# Patient Record
Sex: Female | Born: 1965 | Race: White | Hispanic: No | Marital: Married | Smoking: Former smoker
Health system: Southern US, Community
[De-identification: ages and names within clinical notes are randomized; demographics above are authoritative.]

## PROBLEM LIST (undated history)

## (undated) DIAGNOSIS — J45909 Unspecified asthma, uncomplicated: Secondary | ICD-10-CM

## (undated) DIAGNOSIS — O24419 Gestational diabetes mellitus in pregnancy, unspecified control: Secondary | ICD-10-CM

## (undated) DIAGNOSIS — J302 Other seasonal allergic rhinitis: Secondary | ICD-10-CM

## (undated) HISTORY — DX: Gestational diabetes mellitus in pregnancy, unspecified control: O24.419

## (undated) HISTORY — DX: Unspecified asthma, uncomplicated: J45.909

## (undated) HISTORY — DX: Other seasonal allergic rhinitis: J30.2

---

## 1998-11-05 HISTORY — PX: BREAST BIOPSY: SHX20

## 2013-07-29 ENCOUNTER — Encounter: Payer: Self-pay | Admitting: Sports Medicine

## 2013-07-29 ENCOUNTER — Ambulatory Visit (INDEPENDENT_AMBULATORY_CARE_PROVIDER_SITE_OTHER): Payer: PRIVATE HEALTH INSURANCE | Admitting: Sports Medicine

## 2013-07-29 VITALS — BP 122/78 | HR 59 | Ht 64.5 in | Wt 121.0 lb

## 2013-07-29 DIAGNOSIS — M25579 Pain in unspecified ankle and joints of unspecified foot: Secondary | ICD-10-CM

## 2013-07-29 DIAGNOSIS — M25572 Pain in left ankle and joints of left foot: Secondary | ICD-10-CM

## 2013-07-29 NOTE — Progress Notes (Signed)
CC: Left foot pain HPI: Patient is a pleasant 47 year old female recreational runner who presents with left foot pain since she stubbed her toe in April. She recalls that after the initial injury, where she jammed her toe against a bedpost, she had severe swelling in the area both on the dorsal and plantar aspect of the foot. Because of the swelling she was concerned that she had a fracture and went to see a podiatrist. She had x-rays that were negative. She was told she had a neuroma. She describes shooting pain in the area of the second MTP joint near the metatarsal head. At first it was extremely sensitive to the touch such that even being touched by the bedsheet caused pain. It has since improved and now only causes soreness with running. She initially did have some numbness and tingling however that has resolved. She was given a shoe insert to wear which has helped with her pain. She does note soreness after running. She has had to decrease her mileage and switch to a treadmill to minimize foot discomfort.  ROS: As above in the HPI. All other systems are stable or negative.  PMH: None   Social: Patient previously smoked but quit almost 30 years ago. She drinks 2 glasses of wine twice a week. She is married. Family: There is a strong family history of diabetes in father, grandfather, and grandmother. Mother also has high blood pressure.  Allergies: No known drug allergies    OBJECTIVE: APPEARANCE:  Patient in no acute distress.The patient appeared well nourished and normally developed. HEENT: No scleral icterus. Conjunctiva non-injected Resp: Non labored Skin: No rash MSK:  Foot exam:  - On inspection, there is noted to be no swelling or deformity.  - When standing, patient has neutral arch with no collapse of longitudinal or transverse arch. There is mild subluxation of the fourth and fifth toes. - With running patient is noted to have mild supination.  - There very mild tenderness to  palpation over the second MTP joint but no other tenderness to palpation.  - Ankle range of motion is full without pain. - Strength is 5 out of 5 in ankle and foot.  - Neurovascular status normal.  - No tenderness just proximal to the metatarsal heads - No hallux rigidus  Musculoskeletal [place]:  Limited ultrasound of the second MTP joint was performed in transverse and longitudinal views. On the plantar surface of the foot there is noted to be a hyperechoic region inside the joint space concerning for calcification or bony fragment. On the dorsal surface of the foot hyperechoic fragment was again appreciated along with some increased hypoechoic fluid around the joint space.  ASSESSMENT:  #1. Left second MTP joint pain secondary to probable intra-articular fracture in traumatic injury   PLAN:  Buddy tape the toes at all times when running. Continue sport insoles. We did try adding a neuroma pad to her left shoe. She was also given a metatarsal pad to try if she does not like the neuroma pad. Followup as needed. We did offer to make her a pair of orthotics in the future when her current pair wears out.

## 2013-07-29 NOTE — Patient Instructions (Signed)
Thank you for coming in today  Buddy tape toes when running Try pad on insert Return if you need new insoles or for persistent symptoms

## 2014-02-19 ENCOUNTER — Ambulatory Visit (INDEPENDENT_AMBULATORY_CARE_PROVIDER_SITE_OTHER): Payer: PRIVATE HEALTH INSURANCE | Admitting: Family Medicine

## 2014-02-19 ENCOUNTER — Encounter: Payer: Self-pay | Admitting: Family Medicine

## 2014-02-19 VITALS — BP 100/72 | Temp 98.3°F | Ht 64.0 in | Wt 130.0 lb

## 2014-02-19 DIAGNOSIS — Z7689 Persons encountering health services in other specified circumstances: Secondary | ICD-10-CM

## 2014-02-19 DIAGNOSIS — Z7189 Other specified counseling: Secondary | ICD-10-CM

## 2014-02-19 DIAGNOSIS — Z23 Encounter for immunization: Secondary | ICD-10-CM

## 2014-02-19 NOTE — Progress Notes (Signed)
Chief Complaint  Patient presents with  . Establish Care    HPI:  Colleen Fuentes is here to establish care.  Last PCP and physical: in May 2014 - [facility] - does blood work.  Has the following chronic problems and concerns today:  There are no active problems to display for this patient.  Health Maintenance:  ROS: See pertinent positives and negatives per HPI.  Past Medical History  Diagnosis Date  . Seasonal allergies   . Asthma     mild intermittent    Family History  Problem Relation Age of Onset  . Arthritis Mother   . Stroke Mother   . Heart attack Mother 65  . Rheum arthritis Sister   . Heart disease Sister 24  . Colon cancer Maternal Grandfather   . Heart disease      paternal side  . Diabetes Father   . Diabetes Paternal Grandmother     History   Social History  . Marital Status: Married    Spouse Name: N/A    Number of Children: N/A  . Years of Education: N/A   Social History Main Topics  . Smoking status: Former Smoker    Quit date: 11/06/1983  . Smokeless tobacco: Never Used     Comment: smoked for 6 years  . Alcohol Use: Yes     Comment: wine - 2 glases per day 3 days per week  . Drug Use: No  . Sexual Activity: None   Other Topics Concern  . 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, also Electronics engineer      Spiritual Beliefs: Catholic      Lifestyle: regular exercise (3 days per wee running and free weights and pilates) and healthy diet             Current outpatient prescriptions:cetirizine (ZYRTEC) 10 MG tablet, Take 10 mg by mouth daily., Disp: , Rfl: ;  cholecalciferol (VITAMIN D) 1000 UNITS tablet, Take 1,000 Units by mouth daily., Disp: , Rfl: ;  Cyanocobalamin (VITAMIN B 12) 100 MCG LOZG, Take by mouth., Disp: , Rfl: ;  Glucosamine-Chondroitin (GLUCOSAMINE CHONDR COMPLEX PO), Take by mouth., Disp: , Rfl: ;  Omega-3  Fatty Acids (FISH OIL) 1000 MG CPDR, Take by mouth., Disp: , Rfl:  vitamin E 100 UNIT capsule, Take by mouth daily., Disp: , Rfl:   EXAM:  Filed Vitals:   02/19/14 1432  BP: 100/72  Temp: 98.3 F (36.8 C)    Body mass index is 22.3 kg/(m^2).  GENERAL: vitals reviewed and listed above, alert, oriented, appears well hydrated and in no acute distress  HEENT: atraumatic, conjunttiva clear, no obvious abnormalities on inspection of external nose and ears  NECK: no obvious masses on inspection  LUNGS: clear to auscultation bilaterally, no wheezes, rales or rhonchi, good air movement  CV: HRRR, no peripheral edema  MS: moves all extremities without noticeable abnormality  PSYCH: pleasant and cooperative, no obvious depression or anxiety  ASSESSMENT AND PLAN:  Discussed the following assessment and plan:  No diagnosis found. -We reviewed the PMH, PSH, FH, SH, Meds and Allergies. -We provided refills for any medications we will prescribe as needed. -We addressed current concerns per orders and patient instructions. -We have asked for records for pertinent exams, studies, vaccines and notes from previous providers. -We have advised patient to follow up per  instructions below.   -Patient advised to return or notify a doctor immediately if symptoms worsen or persist or new concerns arise.  Patient Instructions  -PLEASE SIGN UP FOR MYCHART TODAY   We recommend the following healthy lifestyle measures: - eat a healthy diet consisting of lots of vegetables, fruits, beans, nuts, seeds, healthy meats such as white chicken and fish and whole grains.  - avoid fried foods, fast food, processed foods, sodas, red meet and other fattening foods.  - get a least 150 minutes of aerobic exercise per week.   Follow up in: 1 year and as needed      Lucretia Kern

## 2014-02-19 NOTE — Patient Instructions (Signed)
-  PLEASE SIGN UP FOR MYCHART TODAY   We recommend the following healthy lifestyle measures: - eat a healthy diet consisting of lots of vegetables, fruits, beans, nuts, seeds, healthy meats such as white chicken and fish and whole grains.  - avoid fried foods, fast food, processed foods, sodas, red meet and other fattening foods.  - get a least 150 minutes of aerobic exercise per week.   Follow up in: 1 year and as needed

## 2014-02-19 NOTE — Progress Notes (Signed)
Pre visit review using our clinic review tool, if applicable. No additional management support is needed unless otherwise documented below in the visit note. 

## 2014-04-26 ENCOUNTER — Ambulatory Visit (INDEPENDENT_AMBULATORY_CARE_PROVIDER_SITE_OTHER): Payer: PRIVATE HEALTH INSURANCE | Admitting: Physician Assistant

## 2014-04-26 ENCOUNTER — Encounter: Payer: Self-pay | Admitting: Physician Assistant

## 2014-04-26 VITALS — BP 100/60 | HR 60 | Temp 98.4°F | Resp 16 | Ht 64.0 in | Wt 130.0 lb

## 2014-04-26 DIAGNOSIS — E041 Nontoxic single thyroid nodule: Secondary | ICD-10-CM

## 2014-04-26 DIAGNOSIS — Z0000 Encounter for general adult medical examination without abnormal findings: Secondary | ICD-10-CM

## 2014-04-26 LAB — CBC WITH DIFFERENTIAL/PLATELET
BASOS ABS: 0 10*3/uL (ref 0.0–0.1)
Basophils Relative: 0 % (ref 0–1)
EOS ABS: 0 10*3/uL (ref 0.0–0.7)
EOS PCT: 0 % (ref 0–5)
HCT: 39.4 % (ref 36.0–46.0)
Hemoglobin: 13.1 g/dL (ref 12.0–15.0)
Lymphocytes Relative: 26 % (ref 12–46)
Lymphs Abs: 1.8 10*3/uL (ref 0.7–4.0)
MCH: 28.3 pg (ref 26.0–34.0)
MCHC: 33.2 g/dL (ref 30.0–36.0)
MCV: 85.1 fL (ref 78.0–100.0)
Monocytes Absolute: 0.3 10*3/uL (ref 0.1–1.0)
Monocytes Relative: 4 % (ref 3–12)
Neutro Abs: 4.9 10*3/uL (ref 1.7–7.7)
Neutrophils Relative %: 70 % (ref 43–77)
PLATELETS: 181 10*3/uL (ref 150–400)
RBC: 4.63 MIL/uL (ref 3.87–5.11)
RDW: 15.9 % — AB (ref 11.5–15.5)
WBC: 7 10*3/uL (ref 4.0–10.5)

## 2014-04-26 NOTE — Progress Notes (Signed)
Complete Physical  Assessment and Plan: Allergies Asthma Thyroid nodules- get U/S especially with family history of thyroid cancer Health Maintenance  Discussed med's effects and SE's. Screening labs and tests as requested with regular follow-up as recommended.  HPI 48 y.o. female  presents for a new patient complete physical. Her blood pressure has been controlled at home, today their BP is BP: 100/60 mmHg She does workout. She denies chest pain, shortness of breath, dizziness.  She has had very irregular periods, she is seeing Dr. Lynnette Caffey at [facility] and has a history of very bad fibroids and is getting monitored for this.  She states that she has some problems with waking up at 3 AM, worse when she drinks wine or right before her menstrual period.  She has very fiber cystic breast, will get 3D MGM in 1 week.   Current Medications:  Current Outpatient Prescriptions on File Prior to Visit  Medication Sig Dispense Refill  . cetirizine (ZYRTEC) 10 MG tablet Take 10 mg by mouth daily.      . cholecalciferol (VITAMIN D) 1000 UNITS tablet Take 1,000 Units by mouth daily.      . Cyanocobalamin (VITAMIN B 12) 100 MCG LOZG Take 1,000 mcg by mouth daily.       . Glucosamine-Chondroitin (GLUCOSAMINE CHONDR COMPLEX PO) Take 1,500 mg by mouth.       . Omega-3 Fatty Acids (FISH OIL) 1000 MG CPDR Take 1,280 mg by mouth.       . vitamin E 100 UNIT capsule Take 400 Units by mouth daily.        No current facility-administered medications on file prior to visit.   Health Maintenance:   Immunization History  Administered Date(s) Administered  . Tdap 02/19/2014   Tetanus: 2015 Pneumovax: N/A Flu vaccine: N/A Zostavax:N/A Pap: Dec 2014 MGM: in 1 week DEXA: N/A Colonoscopy: N/A EGD: N/A  Allergies:  Allergies  Allergen Reactions  . Niacin And Related    Medical History:  Past Medical History  Diagnosis Date  . Seasonal allergies   . Asthma     mild intermittent  .  Gestational diabetes    Surgical History:  Past Surgical History  Procedure Laterality Date  . Cesarean section    . Breast biopsy  2000    lipoma   Family History:  Family History  Problem Relation Age of Onset  . Arthritis Mother   . Stroke Mother   . Heart attack Mother 37  . Rheum arthritis Sister   . Heart disease Sister 77  . Colon cancer Maternal Grandfather   . Heart disease      paternal side  . Diabetes Father   . Diabetes Paternal Grandmother   . Cancer Sister     thyroid  . Diabetes Sister   . Heart attack Sister    Social History:  History  Substance Use Topics  . Smoking status: Former Smoker    Quit date: 11/06/1983  . Smokeless tobacco: Never Used     Comment: smoked for 6 years  . Alcohol Use: Yes     Comment: wine - 2 glasses per day 3 days per week     Review of Systems: [X]  = complains of  [ ]  = denies  General: Fatigue [ ]  Fever [ ]  Chills [ ]  Weakness [ ]   Insomnia [ ] Weight change [ ]  Night sweats [ ]   Change in appetite [ ]  Eyes: Redness [ ]  Blurred vision [ ]  Diplopia [ ]   Discharge [ ]   ENT: Congestion [ ]  Sinus Pain [ ]  Post Nasal Drip [ ]  Sore Throat [ ]  Earache [ ]  hearing loss [ ]  Tinnitus [ ]  Snoring [ ]   Cardiac: Chest pain/pressure [ ]  SOB [ ]  Orthopnea [ ]   Palpitations [ ]   Paroxysmal nocturnal dyspnea[ ]  Claudication [ ]  Edema [ ]   Pulmonary: Cough [ ]  Wheezing[ ]   SOB [ ]   Pleurisy [ ]   GI: Nausea [ ]  Vomiting[ ]  Dysphagia[ ]  Heartburn[ ]  Abdominal pain [ ]  Constipation [ ] ; Diarrhea [ ]  BRBPR [ ]  Melena[ ]  Bloating [ ]  Hemorrhoids [ ]   GU: Hematuria[ ]  Dysuria [ ]  Nocturia[ ]  Urgency [ ]   Hesitancy [ ]  Discharge [ ]  Frequency [ ]   Breast:  Breast lumps [ ]   nipple discharge [ ]    Neuro: Headaches[ ]  Vertigo[ ]  Paresthesias[ ]  Spasm [ ]  Speech changes [ ]  Incoordination [ ]   Ortho: Arthritis [ ]  Joint pain [ ]  Muscle pain [ ]  Joint swelling [ ]  Back Pain [ ]  Skin:  Rash [ ]   Pruritis [ ]  Change in skin lesion [ ]   Psych:  Depression[ ]  Anxiety[ ]  Confusion [ ]  Memory loss [ ]   Heme/Lypmh: Bleeding [ ]  Bruising [ ]  Enlarged lymph nodes [ ]   Endocrine: Visual blurring [ ]  Paresthesia [ ]  Polyuria [ ]  Polydypsea [ ]    Heat/cold intolerance [ ]  Hypoglycemia [ ]   Physical Exam: Estimated body mass index is 22.3 kg/(m^2) as calculated from the following:   Height as of this encounter: 5\' 4"  (1.626 m).   Weight as of this encounter: 130 lb (58.968 kg). BP 100/60  Pulse 60  Temp(Src) 98.4 F (36.9 C)  Resp 16  Ht 5\' 4"  (1.626 m)  Wt 130 lb (58.968 kg)  BMI 22.30 kg/m2  LMP 04/24/2014 General Appearance: Well nourished, in no apparent distress. Eyes: PERRLA, EOMs, conjunctiva no swelling or erythema, normal fundi and vessels. Sinuses: No Frontal/maxillary tenderness ENT/Mouth: Ext aud canals clear, normal light reflex with TMs without erythema, bulging.  Good dentition. No erythema, swelling, or exudate on post pharynx. Tonsils not swollen or erythematous. Hearing normal.  Neck: Supple, thyroid slightly enlarged with nodule on right side. No bruits Respiratory: Respiratory effort normal, BS equal bilaterally without rales, rhonchi, wheezing or stridor. Cardio: RRR without murmurs, rubs or gallops. Brisk peripheral pulses without edema.  Chest: symmetric, with normal excursions and percussion. Breasts: defer Abdomen: Soft, +BS. Non tender, no guarding, rebound, hernias, masses, or organomegaly. .  Lymphatics: Non tender without lymphadenopathy.  Genitourinary: defer Musculoskeletal: Full ROM all peripheral extremities,5/5 strength, and normal gait. Skin: Warm, dry without rashes, lesions, ecchymosis. Right lower back with 3x4 mm nevus, will monitor Neuro: Cranial nerves intact, reflexes equal bilaterally. Normal muscle tone, no cerebellar symptoms. Sensation intact.  Psych: Awake and oriented X 3, normal affect, Insight and Judgment appropriate.   EKG: WNL no changes.   Vicie Mutters 3:43 PM

## 2014-04-26 NOTE — Patient Instructions (Signed)

## 2014-04-27 ENCOUNTER — Encounter (INDEPENDENT_AMBULATORY_CARE_PROVIDER_SITE_OTHER): Payer: Self-pay

## 2014-04-27 LAB — HEPATIC FUNCTION PANEL
ALBUMIN: 4.6 g/dL (ref 3.5–5.2)
ALT: 13 U/L (ref 0–35)
AST: 24 U/L (ref 0–37)
Alkaline Phosphatase: 49 U/L (ref 39–117)
BILIRUBIN TOTAL: 0.7 mg/dL (ref 0.2–1.2)
Bilirubin, Direct: 0.2 mg/dL (ref 0.0–0.3)
Indirect Bilirubin: 0.5 mg/dL (ref 0.2–1.2)
Total Protein: 7.4 g/dL (ref 6.0–8.3)

## 2014-04-27 LAB — LIPID PANEL
CHOLESTEROL: 145 mg/dL (ref 0–200)
HDL: 71 mg/dL (ref >39)
LDL Cholesterol: 63 mg/dL (ref 0–99)
Total CHOL/HDL Ratio: 2 Ratio
Triglycerides: 56 mg/dL (ref <150)
VLDL: 11 mg/dL (ref 0–40)

## 2014-04-27 LAB — URINALYSIS, ROUTINE W REFLEX MICROSCOPIC
Bilirubin Urine: NEGATIVE
Glucose, UA: NEGATIVE mg/dL
Ketones, ur: NEGATIVE mg/dL
LEUKOCYTES UA: NEGATIVE
NITRITE: NEGATIVE
PROTEIN: NEGATIVE mg/dL
Specific Gravity, Urine: 1.025 (ref 1.005–1.030)
Urobilinogen, UA: 0.2 mg/dL (ref 0.0–1.0)
pH: 5.5 (ref 5.0–8.0)

## 2014-04-27 LAB — MAGNESIUM: Magnesium: 1.9 mg/dL (ref 1.5–2.5)

## 2014-04-27 LAB — BASIC METABOLIC PANEL WITH GFR
BUN: 12 mg/dL (ref 6–23)
CALCIUM: 9.7 mg/dL (ref 8.4–10.5)
CO2: 27 mEq/L (ref 19–32)
CREATININE: 0.66 mg/dL (ref 0.50–1.10)
Chloride: 104 mEq/L (ref 96–112)
Glucose, Bld: 83 mg/dL (ref 70–99)
Potassium: 4.2 mEq/L (ref 3.5–5.3)
SODIUM: 137 meq/L (ref 135–145)

## 2014-04-27 LAB — URINALYSIS, MICROSCOPIC ONLY
Bacteria, UA: NONE SEEN
Casts: NONE SEEN
Crystals: NONE SEEN
SQUAMOUS EPITHELIAL / LPF: NONE SEEN

## 2014-04-27 LAB — MICROALBUMIN / CREATININE URINE RATIO
Creatinine, Urine: 167.8 mg/dL
Microalb Creat Ratio: 4.4 mg/g (ref 0.0–30.0)
Microalb, Ur: 0.74 mg/dL (ref 0.00–1.89)

## 2014-04-27 LAB — TSH: TSH: 0.886 u[IU]/mL (ref 0.350–4.500)

## 2014-04-27 LAB — IRON AND TIBC
%SAT: 13 % — ABNORMAL LOW (ref 20–55)
Iron: 60 ug/dL (ref 42–145)
TIBC: 446 ug/dL (ref 250–470)
UIBC: 386 ug/dL (ref 125–400)

## 2014-04-27 LAB — INSULIN, FASTING: Insulin fasting, serum: 3 u[IU]/mL (ref 3–28)

## 2014-04-27 LAB — FERRITIN: FERRITIN: 7 ng/mL — AB (ref 10–291)

## 2014-04-27 LAB — VITAMIN B12: Vitamin B-12: 1086 pg/mL — ABNORMAL HIGH (ref 211–911)

## 2014-04-27 LAB — HEMOGLOBIN A1C
Hgb A1c MFr Bld: 5.3 % (ref <5.7)
MEAN PLASMA GLUCOSE: 105 mg/dL (ref <117)

## 2014-04-27 LAB — VITAMIN D 25 HYDROXY (VIT D DEFICIENCY, FRACTURES): VIT D 25 HYDROXY: 56 ng/mL (ref 30–89)

## 2014-04-28 ENCOUNTER — Ambulatory Visit
Admission: RE | Admit: 2014-04-28 | Discharge: 2014-04-28 | Disposition: A | Discharge status: Home / Self Care | Payer: PRIVATE HEALTH INSURANCE | Source: Ambulatory Visit | Attending: Physician Assistant | Admitting: Physician Assistant

## 2014-04-28 DIAGNOSIS — E041 Nontoxic single thyroid nodule: Secondary | ICD-10-CM

## 2014-04-29 ENCOUNTER — Encounter: Payer: Self-pay | Admitting: Physician Assistant

## 2015-04-23 IMAGING — US US SOFT TISSUE HEAD/NECK
1 series · 14 of 25 positions shown · non-contrast
Comparison: None.

CLINICAL DATA: Elevated thyroid levels

EXAM:
THYROID ULTRASOUND
TECHNIQUE: Ultrasound examination of the thyroid gland and adjacent soft
tissues was performed.

[Series 1: us soft tissue head/neck · 0.10mm/px · 14 of 63 slices shown]
[im 1/63]
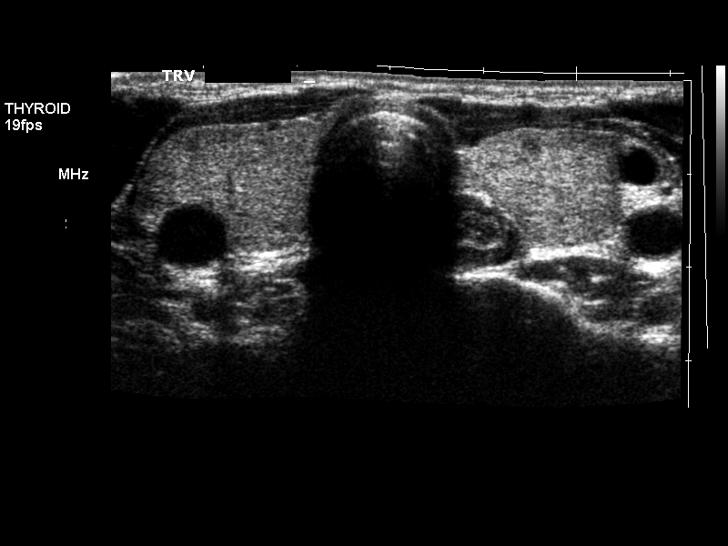
[im 6/63]
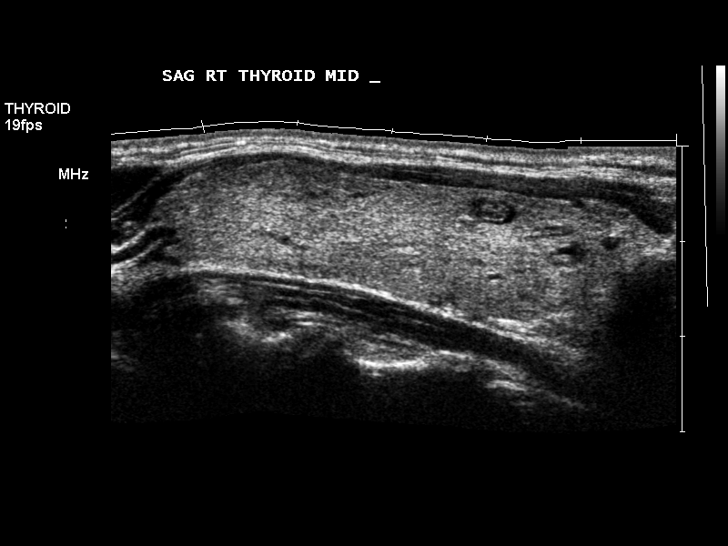
[im 11/63]
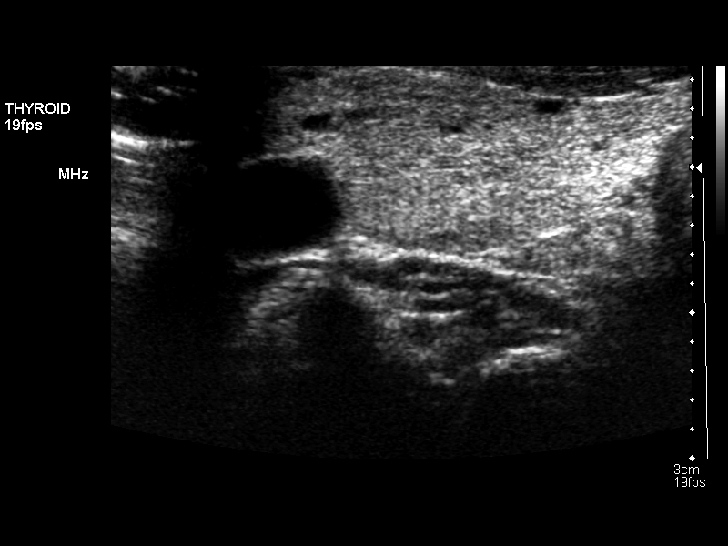
[im 16/63]
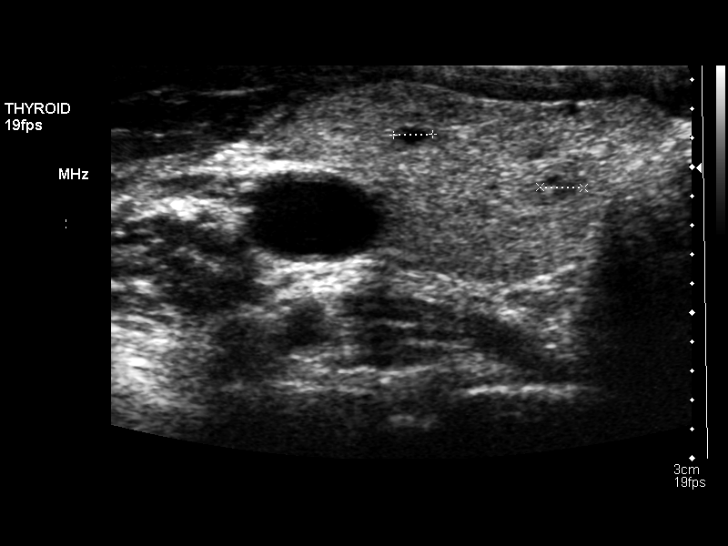
[im 21/63]
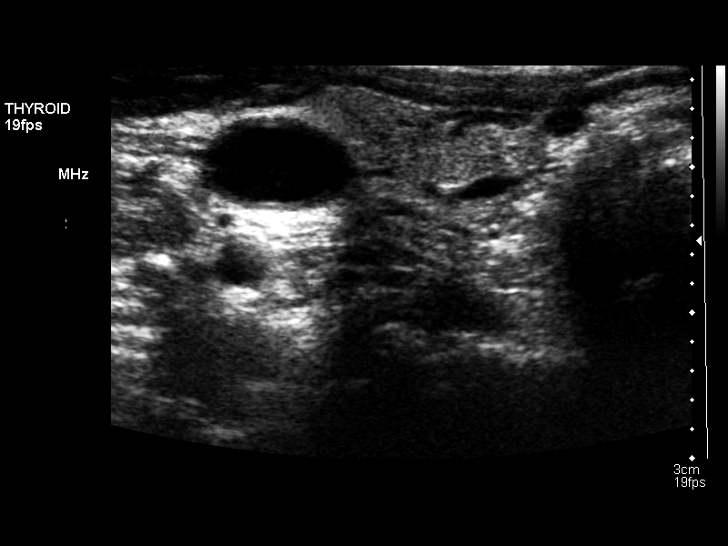
[im 24/63]
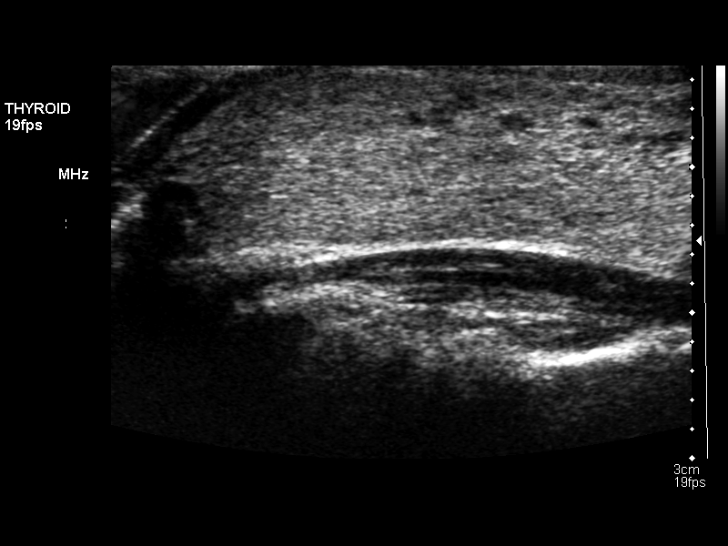
[im 29/63]
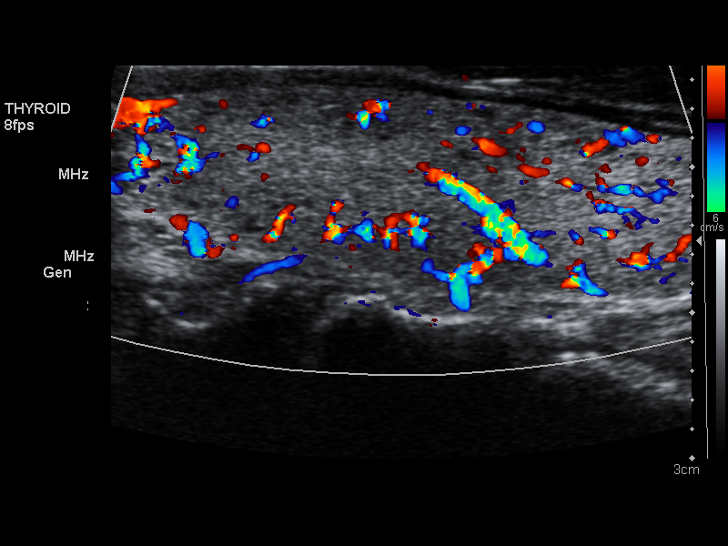
[im 34/63]
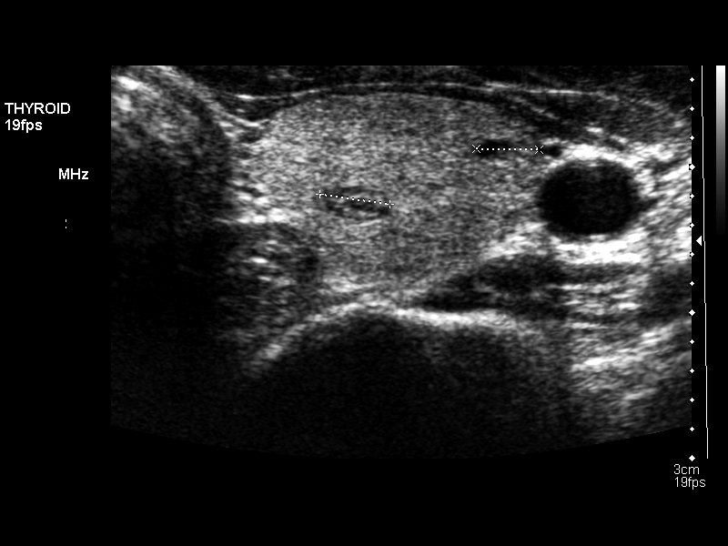
[im 39/63]
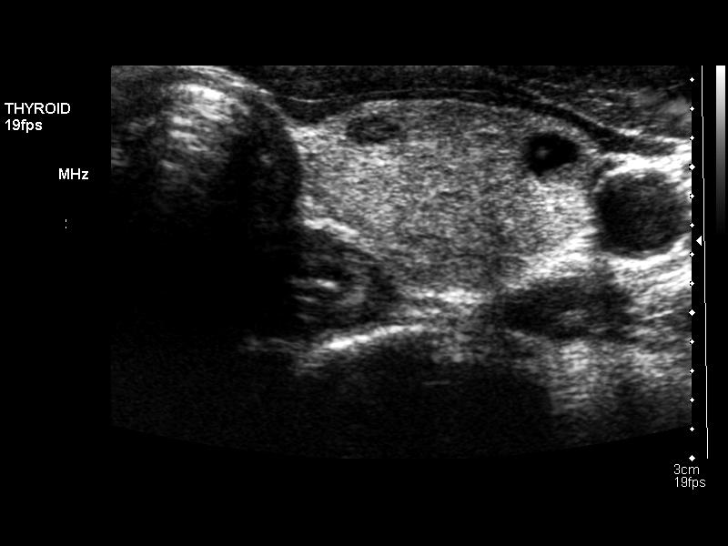
[im 42/63]
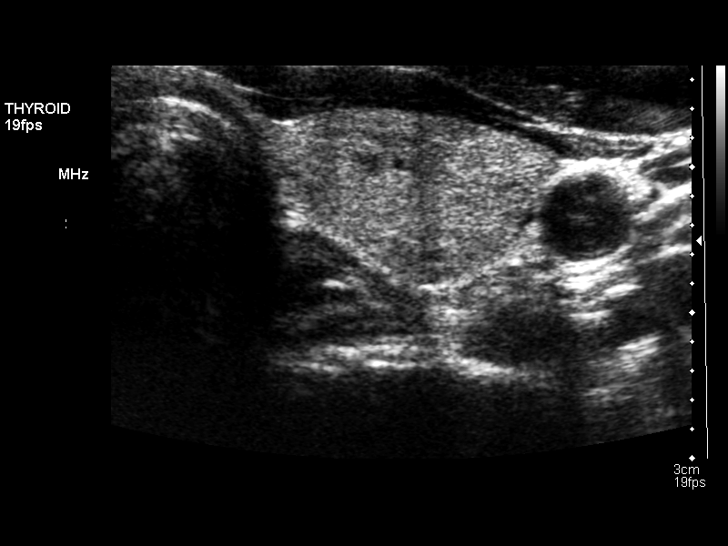
[im 47/63]
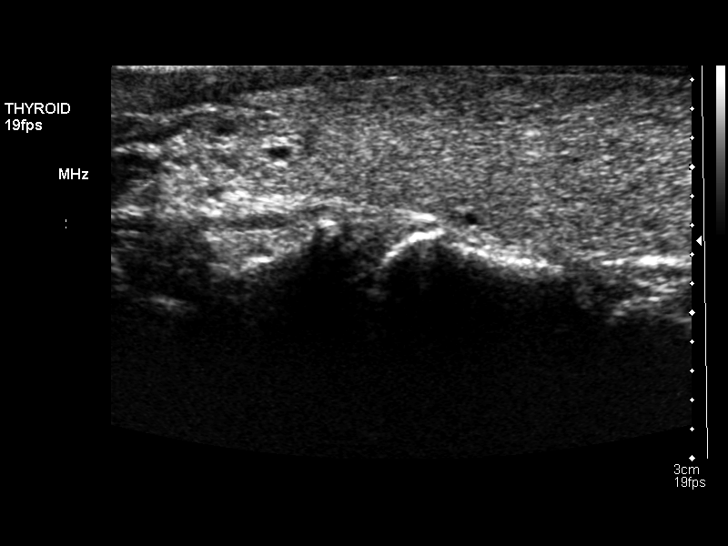
[im 52/63]
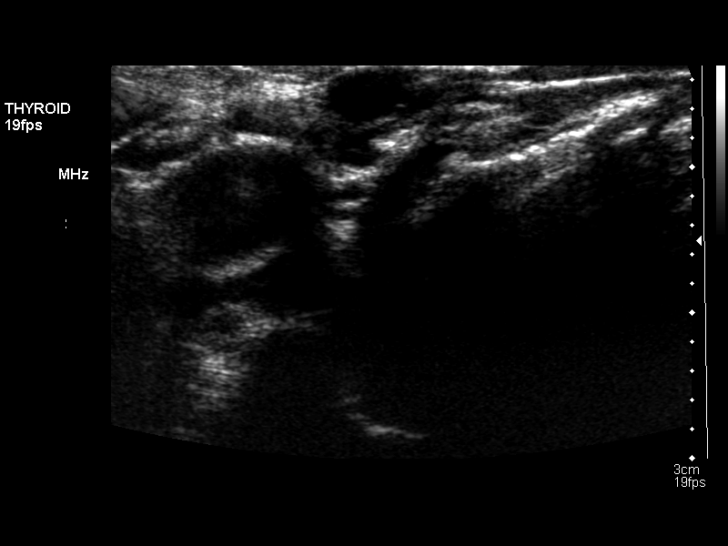
[im 57/63]
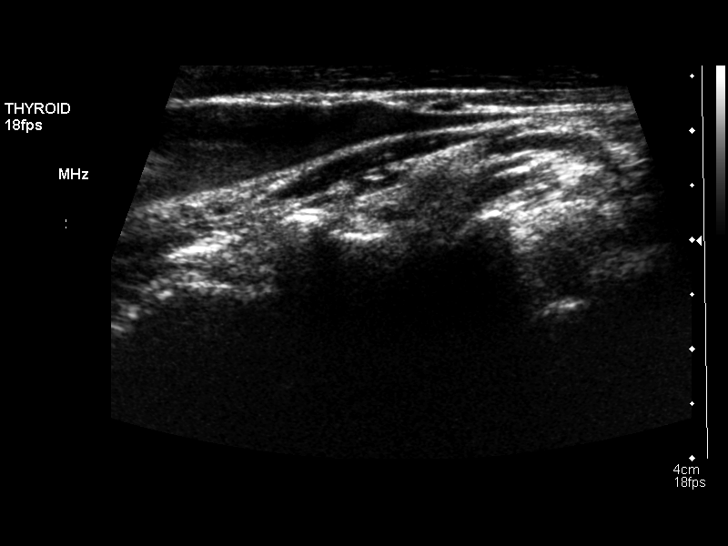
[im 63/63]
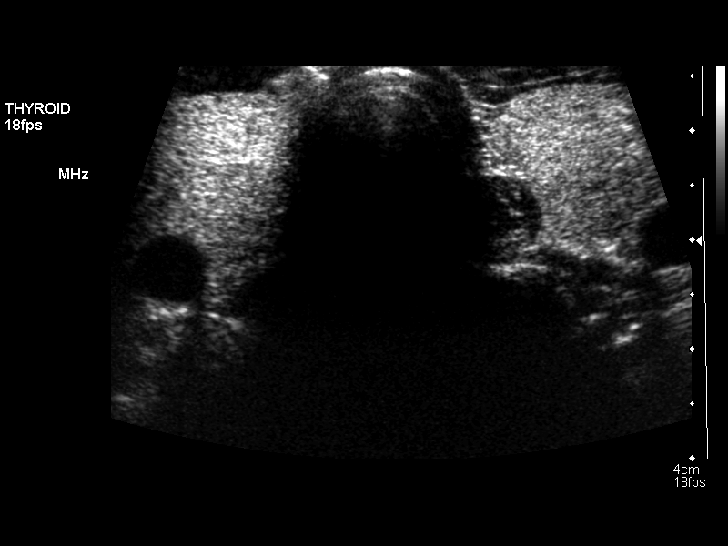

[14 of 25 positions shown; findings below may reference images not displayed]

FINDINGS: Right thyroid lobe

Measurements: 5.5 x 1.3 x 2.4 cm. Multiple small solid and complex
nodules. The largest is in the upper pole measuring 7 mm in greatest
diameter.

Left thyroid lobe

Measurements: 5.0 x 1.3 x 2.2 cm. Multiple small complex as solid
nodules. The largest is a predominately cystic lesion measuring 5
mm.

Isthmus

Thickness: 2 mm.  No nodules visualized.

Lymphadenopathy

None visualized.
IMPRESSION: Multiple small solid and complex nodules bilaterally. Findings do
not meet current SRU consensus criteria for biopsy. Follow-up by
clinical exam is recommended. If patient has known risk factors for
thyroid carcinoma, consider follow-up ultrasound in 12 months. If
patient is clinically hyperthyroid, consider nuclear medicine
thyroid uptake and scan.Reference: Management of Thyroid Nodules
Detected at US: Society of Radiologists in Ultrasound Consensus

## 2015-05-02 ENCOUNTER — Encounter: Payer: Self-pay | Admitting: Physician Assistant

## 2015-06-17 NOTE — H&P (Addendum)
Colleen Fuentes is an 49 y.o. female G5P4 with enlarging symptommatic fibroids that presents for surgical mngt  LMP 06/22/15 - no intercourse since LMP     Past Medical History  Diagnosis Date  . Seasonal allergies   . Asthma     mild intermittent  . Gestational diabetes     Past Surgical History  Procedure Laterality Date  . Cesarean section    . Breast biopsy  2000    lipoma    Family History  Problem Relation Age of Onset  . Arthritis Mother   . Stroke Mother   . Heart attack Mother 66  . Rheum arthritis Sister   . Heart disease Sister 36  . Colon cancer Maternal Grandfather   . Heart disease      paternal side  . Diabetes Father   . Diabetes Paternal Grandmother   . Cancer Sister     thyroid  . Diabetes Sister   . Heart attack Sister     Social History:  reports that she quit smoking about 31 years ago. She has never used smokeless tobacco. She reports that she drinks alcohol. She reports that she does not use illicit drugs.  Allergies:  Allergies  Allergen Reactions  . Niacin And Related Hives and Rash    No prescriptions prior to admission    ROS  AF, VSS  Physical Exam  Gen - NAD Abd - soft, NT CV - RRR Lungs - clear Ext - NT, no edema PV - uterus 12 wks size.  No adnexal masses  Pelvic US:  7.6cm enlarging fibroid, smaller 3cm fibroid.  No adnexal masses  Assessment/Plan: Enlarging, symptomatic fibroids Hysterectomy/bilateral salpingectomy R/b/a discussed, questions answered, informed consent  Bradleigh Sonnen 06/17/2015, 1:49 PM

## 2015-06-23 ENCOUNTER — Inpatient Hospital Stay (HOSPITAL_COMMUNITY): Admission: RE | Admit: 2015-06-23 | Payer: PRIVATE HEALTH INSURANCE | Source: Ambulatory Visit

## 2015-06-27 ENCOUNTER — Encounter (HOSPITAL_COMMUNITY)
Admission: RE | Admit: 2015-06-27 | Discharge: 2015-06-27 | Disposition: A | Discharge status: Home / Self Care | Payer: PRIVATE HEALTH INSURANCE | Source: Ambulatory Visit | Attending: Obstetrics and Gynecology | Admitting: Obstetrics and Gynecology

## 2015-06-27 ENCOUNTER — Encounter (HOSPITAL_COMMUNITY): Payer: Self-pay

## 2015-06-27 NOTE — Patient Instructions (Signed)
Your procedure is scheduled on:  June 30, 2015    Enter through the Main Entrance of [facility] at:  6:00 am   Pick up the phone at the desk and dial [phone].  Call this number if you have problems the morning of surgery: [phone].  Remember: Do NOT eat food: after midnight on Wednesday  Do NOT drink clear liquids after: midnight on Wednesday   Take these medicines the morning of surgery with a SIP OF WATER:  None   Do NOT wear jewelry (body piercing), metal hair clips/bobby pins, make-up, or nail polish. Do NOT wear lotions, powders, or perfumes.  You may wear deoderant. Do NOT shave for 48 hours prior to surgery. Do NOT bring valuables to the hospital. Contacts, dentures, or bridgework may not be worn into surgery. Have a responsible adult drive you home and stay with you for 24 hours after your procedure

## 2015-06-29 MED ORDER — CEFOTETAN DISODIUM 2 G IJ SOLR
2.0000 g | INTRAMUSCULAR | Status: DC
  Filled 2015-06-29: qty 2

## 2015-06-30 ENCOUNTER — Inpatient Hospital Stay (HOSPITAL_COMMUNITY): Payer: PRIVATE HEALTH INSURANCE | Admitting: Anesthesiology

## 2015-06-30 ENCOUNTER — Inpatient Hospital Stay (HOSPITAL_COMMUNITY)
Admission: RE | Admit: 2015-06-30 | Discharge: 2015-07-02 | DRG: 743 | Disposition: A | Discharge status: Home / Self Care | Payer: PRIVATE HEALTH INSURANCE | Source: Ambulatory Visit | Attending: Obstetrics and Gynecology | Admitting: Obstetrics and Gynecology

## 2015-06-30 ENCOUNTER — Encounter (HOSPITAL_COMMUNITY)
Admission: RE | Disposition: A | Discharge status: Home / Self Care | Payer: Self-pay | Source: Ambulatory Visit | Attending: Obstetrics and Gynecology

## 2015-06-30 ENCOUNTER — Encounter (HOSPITAL_COMMUNITY): Payer: Self-pay | Admitting: Certified Registered Nurse Anesthetist

## 2015-06-30 DIAGNOSIS — D259 Leiomyoma of uterus, unspecified: Principal | ICD-10-CM | POA: Diagnosis present

## 2015-06-30 DIAGNOSIS — J45909 Unspecified asthma, uncomplicated: Secondary | ICD-10-CM | POA: Diagnosis present

## 2015-06-30 DIAGNOSIS — Z888 Allergy status to other drugs, medicaments and biological substances status: Secondary | ICD-10-CM

## 2015-06-30 DIAGNOSIS — Z79899 Other long term (current) drug therapy: Secondary | ICD-10-CM

## 2015-06-30 DIAGNOSIS — N8320 Unspecified ovarian cysts: Secondary | ICD-10-CM | POA: Diagnosis present

## 2015-06-30 DIAGNOSIS — D219 Benign neoplasm of connective and other soft tissue, unspecified: Secondary | ICD-10-CM | POA: Diagnosis present

## 2015-06-30 DIAGNOSIS — Z87891 Personal history of nicotine dependence: Secondary | ICD-10-CM | POA: Diagnosis not present

## 2015-06-30 HISTORY — PX: ABDOMINAL HYSTERECTOMY: SHX81

## 2015-06-30 HISTORY — PX: BILATERAL SALPINGECTOMY: SHX5743

## 2015-06-30 LAB — ABO/RH: ABO/RH(D): O POS

## 2015-06-30 LAB — TYPE AND SCREEN
ABO/RH(D): O POS
ANTIBODY SCREEN: NEGATIVE

## 2015-06-30 SURGERY — HYSTERECTOMY, ABDOMINAL
Anesthesia: General | Site: Abdomen | Laterality: Bilateral

## 2015-06-30 MED ORDER — DEXTROSE 5 % IV SOLN
2.0000 g | INTRAVENOUS | Status: DC
  Administered 2015-06-30: 2 g via INTRAVENOUS

## 2015-06-30 MED ORDER — PROPOFOL 10 MG/ML IV BOLUS
INTRAVENOUS | Status: AC
  Filled 2015-06-30: qty 20

## 2015-06-30 MED ORDER — HYDROMORPHONE 0.3 MG/ML IV SOLN
INTRAVENOUS | Status: DC
  Administered 2015-06-30: 0.999 mg via INTRAVENOUS
  Administered 2015-06-30: 4.67 mL via INTRAVENOUS
  Administered 2015-06-30: 12:00:00 via INTRAVENOUS
  Administered 2015-07-01: 0.599 mg via INTRAVENOUS
  Administered 2015-07-01: 0.2 mg via INTRAVENOUS
  Filled 2015-06-30: qty 25

## 2015-06-30 MED ORDER — DIPHENHYDRAMINE HCL 50 MG/ML IJ SOLN: 12.5000 mg | Freq: Four times a day (QID) | INTRAMUSCULAR | Status: DC | PRN

## 2015-06-30 MED ORDER — KETOROLAC TROMETHAMINE 30 MG/ML IJ SOLN
30.0000 mg | Freq: Three times a day (TID) | INTRAMUSCULAR | Status: AC
  Administered 2015-06-30 – 2015-07-01 (×3): 30 mg via INTRAVENOUS
  Filled 2015-06-30 (×3): qty 1

## 2015-06-30 MED ORDER — ROCURONIUM BROMIDE 100 MG/10ML IV SOLN
INTRAVENOUS | Status: DC | PRN
  Administered 2015-06-30: 5 mg via INTRAVENOUS
  Administered 2015-06-30: 30 mg via INTRAVENOUS
  Administered 2015-06-30: 5 mg via INTRAVENOUS

## 2015-06-30 MED ORDER — ACETAMINOPHEN 160 MG/5ML PO SOLN
ORAL | Status: AC
  Filled 2015-06-30: qty 40.6

## 2015-06-30 MED ORDER — EPHEDRINE SULFATE 50 MG/ML IJ SOLN
INTRAMUSCULAR | Status: DC | PRN
  Administered 2015-06-30 (×3): 5 mg via INTRAVENOUS

## 2015-06-30 MED ORDER — ONDANSETRON HCL 4 MG/2ML IJ SOLN
INTRAMUSCULAR | Status: AC
  Filled 2015-06-30: qty 2

## 2015-06-30 MED ORDER — ROCURONIUM BROMIDE 100 MG/10ML IV SOLN
INTRAVENOUS | Status: AC
  Filled 2015-06-30: qty 1

## 2015-06-30 MED ORDER — FENTANYL CITRATE (PF) 250 MCG/5ML IJ SOLN
INTRAMUSCULAR | Status: AC
  Filled 2015-06-30: qty 25

## 2015-06-30 MED ORDER — DEXAMETHASONE SODIUM PHOSPHATE 4 MG/ML IJ SOLN
INTRAMUSCULAR | Status: AC
  Filled 2015-06-30: qty 1

## 2015-06-30 MED ORDER — HYDROMORPHONE HCL 1 MG/ML IJ SOLN
INTRAMUSCULAR | Status: AC
  Administered 2015-06-30: 0.5 mg via INTRAVENOUS
  Filled 2015-06-30: qty 1

## 2015-06-30 MED ORDER — LIDOCAINE HCL (CARDIAC) 20 MG/ML IV SOLN
INTRAVENOUS | Status: AC
  Filled 2015-06-30: qty 5

## 2015-06-30 MED ORDER — KETOROLAC TROMETHAMINE 30 MG/ML IJ SOLN
INTRAMUSCULAR | Status: AC
  Filled 2015-06-30: qty 1

## 2015-06-30 MED ORDER — GLYCOPYRROLATE 0.2 MG/ML IJ SOLN
INTRAMUSCULAR | Status: DC | PRN
  Administered 2015-06-30: 0.4 mg via INTRAVENOUS

## 2015-06-30 MED ORDER — MIDAZOLAM HCL 2 MG/2ML IJ SOLN
INTRAMUSCULAR | Status: AC
  Filled 2015-06-30: qty 4

## 2015-06-30 MED ORDER — OXYCODONE-ACETAMINOPHEN 5-325 MG PO TABS
1.0000 | ORAL_TABLET | ORAL | Status: DC | PRN
  Administered 2015-07-01 (×3): 2 via ORAL
  Administered 2015-07-02 (×3): 1 via ORAL
  Filled 2015-06-30 (×3): qty 1
  Filled 2015-06-30 (×3): qty 2

## 2015-06-30 MED ORDER — MENTHOL 3 MG MT LOZG: 1.0000 | LOZENGE | OROMUCOSAL | Status: DC | PRN

## 2015-06-30 MED ORDER — SCOPOLAMINE 1 MG/3DAYS TD PT72
1.0000 | MEDICATED_PATCH | Freq: Once | TRANSDERMAL | Status: DC
  Administered 2015-06-30: 1.5 mg via TRANSDERMAL

## 2015-06-30 MED ORDER — STERILE WATER FOR IRRIGATION IR SOLN
Status: DC | PRN
  Administered 2015-06-30: 2000 mL

## 2015-06-30 MED ORDER — HYDROMORPHONE HCL 1 MG/ML IJ SOLN
0.2500 mg | INTRAMUSCULAR | Status: DC | PRN
  Administered 2015-06-30 (×4): 0.5 mg via INTRAVENOUS

## 2015-06-30 MED ORDER — ACETAMINOPHEN 160 MG/5ML PO SOLN
975.0000 mg | Freq: Once | ORAL | Status: AC
  Administered 2015-06-30: 975 mg via ORAL

## 2015-06-30 MED ORDER — MEPERIDINE HCL 25 MG/ML IJ SOLN
INTRAMUSCULAR | Status: AC
  Filled 2015-06-30: qty 1

## 2015-06-30 MED ORDER — LIDOCAINE HCL (CARDIAC) 20 MG/ML IV SOLN
INTRAVENOUS | Status: DC | PRN
  Administered 2015-06-30: 80 mg via INTRAVENOUS

## 2015-06-30 MED ORDER — MEPERIDINE HCL 25 MG/ML IJ SOLN: 12.5000 mg | Freq: Once | INTRAMUSCULAR | Status: DC

## 2015-06-30 MED ORDER — EPHEDRINE 5 MG/ML INJ
INTRAVENOUS | Status: AC
  Filled 2015-06-30: qty 10

## 2015-06-30 MED ORDER — KETOROLAC TROMETHAMINE 30 MG/ML IJ SOLN
INTRAMUSCULAR | Status: DC | PRN
  Administered 2015-06-30: 30 mg via INTRAVENOUS

## 2015-06-30 MED ORDER — DEXAMETHASONE SODIUM PHOSPHATE 10 MG/ML IJ SOLN
INTRAMUSCULAR | Status: DC | PRN
  Administered 2015-06-30: 4 mg via INTRAVENOUS

## 2015-06-30 MED ORDER — ONDANSETRON HCL 4 MG/2ML IJ SOLN
4.0000 mg | Freq: Four times a day (QID) | INTRAMUSCULAR | Status: DC | PRN
  Administered 2015-06-30: 4 mg via INTRAVENOUS
  Filled 2015-06-30: qty 2

## 2015-06-30 MED ORDER — ONDANSETRON HCL 4 MG PO TABS: 4.0000 mg | ORAL_TABLET | Freq: Four times a day (QID) | ORAL | Status: DC | PRN

## 2015-06-30 MED ORDER — FENTANYL CITRATE (PF) 100 MCG/2ML IJ SOLN
INTRAMUSCULAR | Status: DC | PRN
  Administered 2015-06-30 (×3): 50 ug via INTRAVENOUS
  Administered 2015-06-30: 25 ug via INTRAVENOUS
  Administered 2015-06-30: 12.5 ug via INTRAVENOUS
  Administered 2015-06-30: 50 ug via INTRAVENOUS
  Administered 2015-06-30: 12.5 ug via INTRAVENOUS
  Administered 2015-06-30 (×2): 50 ug via INTRAVENOUS

## 2015-06-30 MED ORDER — GLYCOPYRROLATE 0.2 MG/ML IJ SOLN
INTRAMUSCULAR | Status: AC
  Filled 2015-06-30: qty 2

## 2015-06-30 MED ORDER — LACTATED RINGERS IV SOLN
INTRAVENOUS | Status: DC
  Administered 2015-06-30 (×3): via INTRAVENOUS

## 2015-06-30 MED ORDER — ONDANSETRON HCL 4 MG/2ML IJ SOLN: 4.0000 mg | Freq: Four times a day (QID) | INTRAMUSCULAR | Status: DC | PRN

## 2015-06-30 MED ORDER — NEOSTIGMINE METHYLSULFATE 10 MG/10ML IV SOLN
INTRAVENOUS | Status: AC
  Filled 2015-06-30: qty 1

## 2015-06-30 MED ORDER — DEXTROSE IN LACTATED RINGERS 5 % IV SOLN
INTRAVENOUS | Status: DC
  Administered 2015-06-30 (×2): via INTRAVENOUS

## 2015-06-30 MED ORDER — FENTANYL CITRATE (PF) 100 MCG/2ML IJ SOLN
INTRAMUSCULAR | Status: AC
  Filled 2015-06-30: qty 4

## 2015-06-30 MED ORDER — ONDANSETRON HCL 4 MG/2ML IJ SOLN
INTRAMUSCULAR | Status: DC | PRN
  Administered 2015-06-30: 4 mg via INTRAVENOUS

## 2015-06-30 MED ORDER — SODIUM CHLORIDE 0.9 % IJ SOLN: 9.0000 mL | INTRAMUSCULAR | Status: DC | PRN

## 2015-06-30 MED ORDER — MIDAZOLAM HCL 2 MG/2ML IJ SOLN
INTRAMUSCULAR | Status: DC | PRN
  Administered 2015-06-30: 2 mg via INTRAVENOUS

## 2015-06-30 MED ORDER — SCOPOLAMINE 1 MG/3DAYS TD PT72
MEDICATED_PATCH | TRANSDERMAL | Status: AC
  Administered 2015-06-30: 1.5 mg via TRANSDERMAL
  Filled 2015-06-30: qty 1

## 2015-06-30 MED ORDER — NALOXONE HCL 0.4 MG/ML IJ SOLN: 0.4000 mg | INTRAMUSCULAR | Status: DC | PRN

## 2015-06-30 MED ORDER — NEOSTIGMINE METHYLSULFATE 10 MG/10ML IV SOLN
INTRAVENOUS | Status: DC | PRN
  Administered 2015-06-30: 3 mg via INTRAVENOUS

## 2015-06-30 MED ORDER — DIPHENHYDRAMINE HCL 12.5 MG/5ML PO ELIX
12.5000 mg | ORAL_SOLUTION | Freq: Four times a day (QID) | ORAL | Status: DC | PRN
  Filled 2015-06-30: qty 5

## 2015-06-30 MED ORDER — PROPOFOL 10 MG/ML IV BOLUS
INTRAVENOUS | Status: DC | PRN
  Administered 2015-06-30: 150 mg via INTRAVENOUS

## 2015-06-30 SURGICAL SUPPLY — 33 items
CANISTER SUCT 3000ML (MISCELLANEOUS) ×3 IMPLANT
CLOTH BEACON ORANGE TIMEOUT ST (SAFETY) ×3 IMPLANT
CONT PATH 16OZ SNAP LID 3702 (MISCELLANEOUS) ×3 IMPLANT
DECANTER SPIKE VIAL GLASS SM (MISCELLANEOUS) IMPLANT
DRAPE CESAREAN BIRTH W POUCH (DRAPES) ×3 IMPLANT
DRAPE WARM FLUID 44X44 (DRAPE) ×3 IMPLANT
DRSG OPSITE POSTOP 4X10 (GAUZE/BANDAGES/DRESSINGS) ×3 IMPLANT
DURAPREP 26ML APPLICATOR (WOUND CARE) ×3 IMPLANT
GAUZE SPONGE 4X4 16PLY XRAY LF (GAUZE/BANDAGES/DRESSINGS) IMPLANT
GLOVE BIO SURGEON STRL SZ 6.5 (GLOVE) ×2 IMPLANT
GLOVE BIO SURGEONS STRL SZ 6.5 (GLOVE) ×1
GLOVE BIOGEL PI IND STRL 7.0 (GLOVE) ×3 IMPLANT
GLOVE BIOGEL PI INDICATOR 7.0 (GLOVE) ×6
GOWN STRL REUS W/TWL LRG LVL3 (GOWN DISPOSABLE) ×9 IMPLANT
HEMOSTAT SURGICEL 4X8 (HEMOSTASIS) IMPLANT
NS IRRIG 1000ML POUR BTL (IV SOLUTION) ×3 IMPLANT
PACK ABDOMINAL GYN (CUSTOM PROCEDURE TRAY) ×3 IMPLANT
PAD OB MATERNITY 4.3X12.25 (PERSONAL CARE ITEMS) ×3 IMPLANT
PROTECTOR NERVE ULNAR (MISCELLANEOUS) ×3 IMPLANT
SPONGE LAP 18X18 X RAY DECT (DISPOSABLE) ×6 IMPLANT
STAPLER VISISTAT 35W (STAPLE) IMPLANT
SUT MNCRL 0 MO-4 VIOLET 18 CR (SUTURE) ×3 IMPLANT
SUT MNCRL 0 VIOLET 6X18 (SUTURE) ×1 IMPLANT
SUT MON AB-0 CT1 36 (SUTURE) ×3 IMPLANT
SUT MONOCRYL 0 6X18 (SUTURE) ×2
SUT MONOCRYL 0 MO 4 18  CR/8 (SUTURE) ×6
SUT PDS AB 0 CTX 60 (SUTURE) ×3 IMPLANT
SUT PLAIN 2 0 XLH (SUTURE) IMPLANT
SUT VIC AB 3-0 X1 27 (SUTURE) IMPLANT
SUT VIC AB 4-0 KS 27 (SUTURE) ×3 IMPLANT
TOWEL OR 17X24 6PK STRL BLUE (TOWEL DISPOSABLE) ×6 IMPLANT
TRAY FOLEY CATH SILVER 14FR (SET/KITS/TRAYS/PACK) ×3 IMPLANT
WATER STERILE IRR 1000ML POUR (IV SOLUTION) ×3 IMPLANT

## 2015-06-30 NOTE — Anesthesia Preprocedure Evaluation (Signed)
Anesthesia Evaluation  Patient identified by MRN, date of birth, ID band Patient awake    Reviewed: Allergy & Precautions, H&P , Patient's Chart, lab work & pertinent test results, reviewed documented beta blocker date and time   Airway Mallampati: II  TM Distance: >3 FB Neck ROM: full    Dental no notable dental hx.    Pulmonary former smoker,  breath sounds clear to auscultation  Pulmonary exam normal       Cardiovascular Rhythm:regular Rate:Normal     Neuro/Psych    GI/Hepatic   Endo/Other  diabetes  Renal/GU      Musculoskeletal   Abdominal   Peds  Hematology   Anesthesia Other Findings   Reproductive/Obstetrics                             Anesthesia Physical Anesthesia Plan  ASA: II  Anesthesia Plan: General   Post-op Pain Management:    Induction: Intravenous  Airway Management Planned: Oral ETT  Additional Equipment:   Intra-op Plan:   Post-operative Plan: Extubation in OR  Informed Consent: I have reviewed the patients History and Physical, chart, labs and discussed the procedure including the risks, benefits and alternatives for the proposed anesthesia with the patient or authorized representative who has indicated his/her understanding and acceptance.   Dental Advisory Given and Dental advisory given  Plan Discussed with: CRNA and Surgeon  Anesthesia Plan Comments: (  Discussed general anesthesia, including possible nausea, instrumentation of airway, sore throat,pulmonary aspiration, etc. I asked if the were any outstanding questions, or  concerns before we proceeded. )        Anesthesia Quick Evaluation

## 2015-06-30 NOTE — OR Nursing (Signed)
Dr. Lyndle Herrlich states pt may be discharged to floor. Darin Engels rn

## 2015-06-30 NOTE — Addendum Note (Signed)
Addendum  created 06/30/15 1530 by Jonna Munro, CRNA   Modules edited: Notes Section   Notes Section:  File: [id number]

## 2015-06-30 NOTE — Anesthesia Procedure Notes (Signed)
Procedure Name: Intubation Date/Time: 06/30/2015 7:28 AM Performed by: Raenette Rover Pre-anesthesia Checklist: Patient identified, Patient being monitored, Emergency Drugs available and Suction available Patient Re-evaluated:Patient Re-evaluated prior to inductionOxygen Delivery Method: Circle system utilized Preoxygenation: Pre-oxygenation with 100% oxygen Intubation Type: IV induction Ventilation: Mask ventilation without difficulty Laryngoscope Size: Miller and 2 Grade View: Grade I Tube type: Oral Tube size: 7.0 mm Number of attempts: 1 Airway Equipment and Method: Stylet Placement Confirmation: ETT inserted through vocal cords under direct vision,  breath sounds checked- equal and bilateral,  positive ETCO2 and CO2 detector Secured at: 20 cm Tube secured with: Tape Dental Injury: Teeth and Oropharynx as per pre-operative assessment

## 2015-06-30 NOTE — Anesthesia Postprocedure Evaluation (Signed)
  Anesthesia Post-op Note  Patient: Colleen Fuentes  Procedure(s) Performed: Procedure(s): HYSTERECTOMY ABDOMINAL (Bilateral) BILATERAL SALPINGECTOMY (Bilateral) Patient is awake and responsive. Pain and nausea are reasonably well controlled. Vital signs are stable and clinically acceptable. Oxygen saturation is clinically acceptable. There are no apparent anesthetic complications at this time. Patient is ready for discharge.

## 2015-06-30 NOTE — Anesthesia Postprocedure Evaluation (Signed)
  Anesthesia Post-op Note  Patient: Colleen Fuentes  Procedure(s) Performed: Procedure(s): HYSTERECTOMY ABDOMINAL (Bilateral) BILATERAL SALPINGECTOMY (Bilateral)  Patient Location: [facility]  Anesthesia Type:General  Level of Consciousness: awake, alert  and oriented  Airway and Oxygen Therapy: Patient Spontanous Breathing and Patient connected to nasal cannula oxygen  Post-op Pain: none  Post-op Assessment: Post-op Vital signs reviewed, Patient's Cardiovascular Status Stable, Respiratory Function Stable, Patent Airway, No signs of Nausea or vomiting and Pain level controlled              Post-op Vital Signs: Reviewed and stable  Last Vitals:  Filed Vitals:   06/30/15 1445  BP: 114/71  Pulse: 53  Temp: 36.6 C  Resp: 14    Complications: No apparent anesthesia complications

## 2015-06-30 NOTE — Op Note (Signed)
Colleen Fuentes, Colleen Fuentes  MEDICAL RECORD NO.:  [id number]  LOCATION:  1224                          FACILITY:  [facility]  PHYSICIAN:  Marylynn Pearson, MD    DATE OF BIRTH:  1966/04/07  DATE OF PROCEDURE:  06/30/2015 DATE OF DISCHARGE:                              OPERATIVE REPORT   PREOPERATIVE DIAGNOSIS:  Symptomatic fibroids.  POSTOPERATIVE DIAGNOSIS:  Symptomatic fibroids.  PROCEDURE: 1. Total abdominal hysterectomy with bilateral salpingectomy. 2. Incision and drainage of left ovarian cyst.  SURGEON:  Marylynn Pearson, MD.  ASSISTANT:  Morris.  BLOOD LOSS:  200 mL.  URINE OUTPUT:  Clearing.  ANESTHESIA:  General.  COMPLICATIONS:  None.  SPECIMEN:  Uterus with bilateral fallopian tubes.  PROCEDURE:  The patient was taken to the operating room.  After informed consent was obtained, she was placed in the supine position, prepped and draped in sterile fashion, and a Foley catheter was inserted.  A Pfannenstiel skin incision was made with a scalpel and extended to the level of the fascia.  The fascia was incised in the midline and extended laterally using curved Mayo scissors.  Underlying rectus muscles were dissected off and the peritoneum was identified, entered sharply.  This was extended superiorly and inferiorly.  Uterus was delivered through the incision.  Round ligaments were grasped, suture ligated, and cut bilaterally.  The mesosalpinx was clamped, cut, and tied.  Right fallopian tube was excised.  Left fallopian tube remained with the uterus.  The infundibulopelvic ligament was then skeletonized and doubly clamped, cut, and doubly suture ligated.  Excellent hemostasis was noted bilaterally.  Bladder flap was then developed with sharp and blunt dissection.  Large cervical fibroid was noted.  Bladder was dissected from the cervix using a dry lap sponge.  Bilateral uterine arteries were clamped, cut, and suture ligated.  Cardinal  ligaments and infundibulopelvic ligaments were clamped, cut, and suture ligated.  The uterus and cervix were then amputated from the vagina and passed off to be sent to pathology.  The vaginal cuff was reapproximated using a series of figure-of-eight sutures with Monocryl.  The pelvis was then copiously irrigated with saline.  All pedicles were reinspected and found to be hemostatic.  The left ovary was found to have a small 1 cm ovarian cyst.  This was incised to make sure it was not representative of an endometrioma or a dermoid.  Small clot was evacuated consistent with a hemorrhagic corpus luteum cyst.  All instruments and packs were then removed from the abdomen and pelvis.  The fascia was closed with a looped PDS. The skin was closed with 4-0 Vicryl.  LiquiBand skin glue was placed over the incision.  She was extubated and taken to the recovery room in stable condition.  Sponge, lap, needle, and instrument counts were correct x2.     Marylynn Pearson, MD     GA/MEDQ  D:  06/30/2015  T:  06/30/2015  Job:  [id number]

## 2015-06-30 NOTE — Progress Notes (Signed)
Day of Surgery Procedure(s) (LRB): HYSTERECTOMY ABDOMINAL (Bilateral) BILATERAL SALPINGECTOMY (Bilateral)  Subjective: Patient reports incisional pain.    Objective: I have reviewed patient's vital signs, intake and output and medications. UOP adequate & clear  General: alert and cooperative GI: normal findings: soft, non-tender Vaginal Bleeding: none  Assessment: s/p Procedure(s): HYSTERECTOMY ABDOMINAL (Bilateral) BILATERAL SALPINGECTOMY (Bilateral): stable  Plan: Routine post op care continue IV PCA  Add toradol  LOS: 0 days    Vamsi Apfel 06/30/2015, 1:28 PM

## 2015-06-30 NOTE — Transfer of Care (Signed)
Immediate Anesthesia Transfer of Care Note  Patient: Colleen Fuentes  Procedure(s) Performed: Procedure(s): HYSTERECTOMY ABDOMINAL (Bilateral) BILATERAL SALPINGECTOMY (Bilateral)  Patient Location: PACU  Anesthesia Type:General  Level of Consciousness: awake, alert , oriented and patient cooperative  Airway & Oxygen Therapy: Patient Spontanous Breathing and Patient connected to nasal cannula oxygen  Post-op Assessment: Report given to RN and Post -op Vital signs reviewed and stable  Post vital signs: Reviewed and stable  Last Vitals:  Filed Vitals:   06/30/15 0618  BP: 120/81  Pulse: 60  Temp: 36.4 C  Resp: 18    Complications: No apparent anesthesia complications

## 2015-07-01 ENCOUNTER — Encounter (HOSPITAL_COMMUNITY): Payer: Self-pay | Admitting: Obstetrics and Gynecology

## 2015-07-01 LAB — CBC
HEMATOCRIT: 31.9 % — AB (ref 36.0–46.0)
Hemoglobin: 10.6 g/dL — ABNORMAL LOW (ref 12.0–15.0)
MCH: 30.5 pg (ref 26.0–34.0)
MCHC: 33.2 g/dL (ref 30.0–36.0)
MCV: 91.7 fL (ref 78.0–100.0)
PLATELETS: 123 10*3/uL — AB (ref 150–400)
RBC: 3.48 MIL/uL — ABNORMAL LOW (ref 3.87–5.11)
RDW: 15.5 % (ref 11.5–15.5)
WBC: 8.3 10*3/uL (ref 4.0–10.5)

## 2015-07-01 MED ORDER — DOCUSATE SODIUM 100 MG PO CAPS
100.0000 mg | ORAL_CAPSULE | Freq: Every day | ORAL | Status: DC
  Administered 2015-07-01 – 2015-07-02 (×2): 100 mg via ORAL
  Filled 2015-07-01 (×2): qty 1

## 2015-07-01 NOTE — Progress Notes (Signed)
1 Day Post-Op Procedure(s) (LRB): HYSTERECTOMY ABDOMINAL (Bilateral) BILATERAL SALPINGECTOMY (Bilateral)  Subjective: Patient reports tolerating PO and no problems voiding.  No flatus yet.  Tolerating regular diet w/out n/v  Objective: I have reviewed patient's vital signs, intake and output, medications and labs.  General: alert and cooperative GI: normal findings: soft, non-tender Extremities: no edema, redness or tenderness in the calves or thighs Vaginal Bleeding: none  Inc:  C/d/i bandage  Assessment: s/p Procedure(s): HYSTERECTOMY ABDOMINAL (Bilateral) BILATERAL SALPINGECTOMY (Bilateral): stable, progressing well and tolerating diet  Plan: Encourage ambulation Advance to PO medication  Advance diet  LOS: 1 day    Colleen Fuentes 07/01/2015, 8:27 AM

## 2015-07-01 NOTE — Progress Notes (Signed)
1 Day Post-Op Procedure(s) (LRB): HYSTERECTOMY ABDOMINAL (Bilateral) BILATERAL SALPINGECTOMY (Bilateral)  Subjective: Patient reports tolerating PO and no problems voiding.  No flatus yet  Objective: I have reviewed patient's vital signs, intake and output, medications and labs.  General: alert and cooperative GI: normal findings: soft, non-tender Vaginal Bleeding: minimal  Assessment: s/p Procedure(s): HYSTERECTOMY ABDOMINAL (Bilateral) BILATERAL SALPINGECTOMY (Bilateral): stable and progressing well  Plan: routine postop care  LOS: 1 day    Colleen Fuentes 07/01/2015, 1:36 PM

## 2015-07-02 MED ORDER — HYDROCODONE-ACETAMINOPHEN 5-325 MG PO TABS: 1.0000 | ORAL_TABLET | Freq: Four times a day (QID) | ORAL | Status: AC | PRN

## 2015-07-02 MED ORDER — IBUPROFEN 600 MG PO TABS: 600.0000 mg | ORAL_TABLET | Freq: Four times a day (QID) | ORAL | Status: AC | PRN

## 2015-07-02 MED ORDER — SIMETHICONE 80 MG PO CHEW
80.0000 mg | CHEWABLE_TABLET | Freq: Four times a day (QID) | ORAL | Status: DC | PRN
  Administered 2015-07-02: 80 mg via ORAL
  Filled 2015-07-02: qty 1

## 2015-07-02 MED ORDER — DOCUSATE SODIUM 100 MG PO CAPS: 100.0000 mg | ORAL_CAPSULE | Freq: Every day | ORAL | Status: AC

## 2015-07-02 MED ORDER — BISACODYL 5 MG PO TBEC
5.0000 mg | DELAYED_RELEASE_TABLET | Freq: Every day | ORAL | Status: DC | PRN
  Administered 2015-07-02: 5 mg via ORAL
  Filled 2015-07-02 (×3): qty 1

## 2015-07-02 MED ORDER — OXYCODONE-ACETAMINOPHEN 5-325 MG PO TABS: 1.0000 | ORAL_TABLET | ORAL | Status: AC | PRN

## 2015-07-02 MED ORDER — IBUPROFEN 600 MG PO TABS
600.0000 mg | ORAL_TABLET | Freq: Four times a day (QID) | ORAL | Status: DC | PRN
  Administered 2015-07-02: 600 mg via ORAL
  Filled 2015-07-02: qty 1

## 2015-07-02 NOTE — Discharge Instructions (Signed)
Abdominal Hysterectomy, Care After Refer to this sheet in the next few weeks. These instructions provide you with information on caring for yourself after your procedure. Your health care provider may also give you more specific instructions. Your treatment has been planned according to current medical practices, but problems sometimes occur. Call your health care provider if you have any problems or questions after your procedure.  WHAT TO EXPECT AFTER THE PROCEDURE After your procedure, it is typical to have the following:  Pain.  Feeling tired.  Poor appetite.  Less interest in sex. HOME CARE INSTRUCTIONS  It takes 4-6 weeks to recover from this surgery. Make sure you follow all your health care provider's instructions. Home care instructions may include:  Take pain medicines only as directed by your health care provider. Do not take over-the-counter pain medicines without checking with your health care provider first.  Change your bandage as directed by your health care provider.  Return to your health care provider to have your sutures taken out.  Take showers instead of baths for 2-3 weeks. Ask your health care provider when it is safe to start showering.  Do not douche, use tampons, or have sexual intercourse for at least 6 weeks or until your health care provider says you can.   Follow your health care provider's advice about exercise, lifting, driving, and general activities.  Get plenty of rest and sleep.   Do not lift anything heavier than a gallon of milk (about 10 lb [4.5 kg]) for the first month after surgery.  You can resume your normal diet if your health care provider says it is okay.   Do not drink alcohol until your health care provider says you can.   If you are constipated, ask your health care provider if you can take a mild laxative.  Eating foods high in fiber may also help with constipation. Eat plenty of raw fruits and vegetables, whole grains, and  beans.  Drink enough fluids to keep your urine clear or pale yellow.   Try to have someone at home with you for the first 1-2 weeks to help around the house.  Keep all follow-up appointments. SEEK MEDICAL CARE IF:   You have chills or fever.  You have swelling, redness, or pain in the area of your incision that is getting worse.   You have pus coming from the incision.   You notice a bad smell coming from the incision or bandage.   Your incision breaks open.   You feel dizzy or light-headed.   You have pain or bleeding when you urinate.   You have persistent diarrhea.   You have persistent nausea and vomiting.   You have abnormal vaginal discharge.   You have a rash.   You have any type of abnormal reaction or develop an allergy to your medicine.   Your pain medicine is not helping.  SEEK IMMEDIATE MEDICAL CARE IF:   You have a fever and your symptoms suddenly get worse.  You have severe abdominal pain.  You have chest pain.  You have shortness of breath.  You faint.  You have pain, swelling, or redness of your leg.  You have heavy vaginal bleeding with blood clots. MAKE SURE YOU:  Understand these instructions.  Will watch your condition.  Will get help right away if you are not doing well or get worse. Document Released: 05/11/2005 Document Revised: 10/27/2013 Document Reviewed: 08/14/2013 Exit[employer] Patient Information 2015 [employer]. This information is not intended   to replace advice given to you by your health care provider. Make sure you discuss any questions you have with your health care provider.  

## 2015-07-02 NOTE — Progress Notes (Signed)
D/c instructions and prescriptions reviewed with pt. Pt reports passing flatus. Pt will call for scheduled follow up appointment. Pt d/c home with family, stable via wc. Dr. Julien Girt notified of flatus status.

## 2015-07-02 NOTE — Progress Notes (Signed)
2 Days Post-Op Procedure(s) (LRB): HYSTERECTOMY ABDOMINAL (Bilateral) BILATERAL SALPINGECTOMY (Bilateral)  Subjective: Patient reports incisional pain, tolerating PO and no problems voiding.  No flatus yet, c/o gas pain  Objective: I have reviewed patient's vital signs, intake and output, medications and labs. + bs General: alert and cooperative Cardio: regular rate and rhythm GI: normal findings: soft, non-tender Extremities: extremities normal, atraumatic, no cyanosis or edema Vaginal Bleeding: minimal  Inc bandage - clean Assessment: s/p Procedure(s): HYSTERECTOMY ABDOMINAL (Bilateral) BILATERAL SALPINGECTOMY (Bilateral): progressing well  Plan: Advance diet Encourage ambulation Advance to PO medication  Pt desires discharge Will try simethicone this am for gas pain Add ibuprofen  LOS: 2 days    Colleen Fuentes 07/02/2015, 7:42 AM

## 2015-07-06 NOTE — Discharge Summary (Signed)
Physician Discharge Summary  Patient ID: Colleen Fuentes MRN: [id number] DOB/AGE: December 06, 1965 49 y.o.  Admit date: 06/30/2015 Discharge date: 07/06/2015  Admission Diagnoses:  Symptomatic fibroids  Discharge Diagnoses:  Active Problems:   Fibroids   Discharged Condition: good  Hospital Course: Pt was admitted for post op care.  She remained afebrile with stable vs throughout her hospital stay.  On POD#1, her diet was slowly advanced and she was able to ambulate.  Foley catheter was removed and she was able to void without difficulty.  POD#2, she was tolerating a regular diet, pain was controlled with PO meds.  She was having some bloating initially that improved as she began to pass flatus just prior to discharge.    Consults: None  Significant Diagnostic Studies: labs: cbc  Treatments: surgery: TAH/BS  Discharge Exam: Blood pressure 120/68, pulse 54, temperature 97.7 F (36.5 C), temperature source Oral, resp. rate 16, height 5\' 5"  (1.651 m), weight 131 lb (59.421 kg), SpO2 100 %. General appearance: alert and cooperative GI: normal findings: soft, NT/ND.  + BS Extremities: extremities normal, atraumatic, no cyanosis or edema Incision/Wound:c/d/i  Disposition: 01-Home or Self Care     Medication List    TAKE these medications        Biotin 10 MG Caps  Take 1 capsule by mouth daily.     cetirizine 10 MG tablet  Commonly known as:  ZYRTEC  Take 10 mg by mouth daily.     cholecalciferol 1000 UNITS tablet  Commonly known as:  VITAMIN D  Take 1,000 Units by mouth daily.     docusate sodium 100 MG capsule  Commonly known as:  COLACE  Take 1 capsule (100 mg total) by mouth daily.     Fish Oil 1000 MG Cpdr  Take 1,280 mg by mouth.     GLUCOSAMINE CHONDR COMPLEX PO  Take 1,500 mg by mouth daily.     HYDROcodone-acetaminophen 5-325 MG per tablet  Commonly known as:  NORCO  Take 1 tablet by mouth every 6 (six) hours as needed for moderate pain.     ibuprofen 600  MG tablet  Commonly known as:  ADVIL,MOTRIN  Take 1 tablet (600 mg total) by mouth every 6 (six) hours as needed for moderate pain.     OVER THE COUNTER MEDICATION  Take 1 tablet by mouth daily. Pt takes a plant based Magnesium 1000mg .     oxyCODONE-acetaminophen 5-325 MG per tablet  Commonly known as:  PERCOCET/ROXICET  Take 1-2 tablets by mouth every 4 (four) hours as needed for severe pain (moderate to severe pain (when tolerating fluids)).     Vitamin B 12 100 MCG Lozg  Take 1,000 mcg by mouth daily.     vitamin C 250 MG tablet  Commonly known as:  ASCORBIC ACID  Take 250 mg by mouth daily.     vitamin E 100 UNIT capsule  Take 400 Units by mouth daily.           Follow-up Information    Schedule an appointment as soon as possible for a visit in 2 weeks to follow up.      Signed: Yuna Pizzolato 07/06/2015, 1:03 PM

## 2017-10-31 ENCOUNTER — Encounter: Payer: Self-pay | Admitting: Family Medicine

## 2020-12-27 DIAGNOSIS — Z0000 Encounter for general adult medical examination without abnormal findings: Secondary | ICD-10-CM | POA: Diagnosis not present

## 2020-12-27 DIAGNOSIS — Z13220 Encounter for screening for lipoid disorders: Secondary | ICD-10-CM | POA: Diagnosis not present

## 2020-12-27 DIAGNOSIS — Z20822 Contact with and (suspected) exposure to covid-19: Secondary | ICD-10-CM | POA: Diagnosis not present

## 2021-01-16 DIAGNOSIS — Z20822 Contact with and (suspected) exposure to covid-19: Secondary | ICD-10-CM | POA: Diagnosis not present

## 2021-01-23 DIAGNOSIS — Z6824 Body mass index (BMI) 24.0-24.9, adult: Secondary | ICD-10-CM | POA: Diagnosis not present

## 2021-01-23 DIAGNOSIS — Z01419 Encounter for gynecological examination (general) (routine) without abnormal findings: Secondary | ICD-10-CM | POA: Diagnosis not present

## 2021-01-23 DIAGNOSIS — Z1231 Encounter for screening mammogram for malignant neoplasm of breast: Secondary | ICD-10-CM | POA: Diagnosis not present

## 2021-08-02 DIAGNOSIS — R102 Pelvic and perineal pain: Secondary | ICD-10-CM | POA: Diagnosis not present

## 2021-08-18 ENCOUNTER — Other Ambulatory Visit: Payer: Self-pay | Admitting: Family Medicine

## 2021-08-18 DIAGNOSIS — R102 Pelvic and perineal pain: Secondary | ICD-10-CM

## 2021-08-28 ENCOUNTER — Ambulatory Visit
Admission: RE | Admit: 2021-08-28 | Discharge: 2021-08-28 | Disposition: A | Discharge status: Home / Self Care | Payer: BLUE CROSS/BLUE SHIELD | Source: Ambulatory Visit | Attending: Family Medicine | Admitting: Family Medicine

## 2021-08-28 DIAGNOSIS — R102 Pelvic and perineal pain: Secondary | ICD-10-CM | POA: Diagnosis not present

## 2021-08-28 DIAGNOSIS — Z90710 Acquired absence of both cervix and uterus: Secondary | ICD-10-CM | POA: Diagnosis not present

## 2022-01-02 DIAGNOSIS — B351 Tinea unguium: Secondary | ICD-10-CM | POA: Diagnosis not present

## 2022-01-02 DIAGNOSIS — Z13220 Encounter for screening for lipoid disorders: Secondary | ICD-10-CM | POA: Diagnosis not present

## 2022-01-02 DIAGNOSIS — Z0000 Encounter for general adult medical examination without abnormal findings: Secondary | ICD-10-CM | POA: Diagnosis not present

## 2022-01-11 ENCOUNTER — Ambulatory Visit: Payer: BLUE CROSS/BLUE SHIELD | Admitting: Podiatry

## 2022-01-23 DIAGNOSIS — Z20822 Contact with and (suspected) exposure to covid-19: Secondary | ICD-10-CM | POA: Diagnosis not present

## 2022-01-23 DIAGNOSIS — J0140 Acute pansinusitis, unspecified: Secondary | ICD-10-CM | POA: Diagnosis not present

## 2022-01-23 DIAGNOSIS — J988 Other specified respiratory disorders: Secondary | ICD-10-CM | POA: Diagnosis not present

## 2022-01-30 ENCOUNTER — Ambulatory Visit: Payer: BLUE CROSS/BLUE SHIELD | Admitting: Podiatry

## 2022-01-30 DIAGNOSIS — B351 Tinea unguium: Secondary | ICD-10-CM | POA: Diagnosis not present

## 2022-02-01 DIAGNOSIS — R002 Palpitations: Secondary | ICD-10-CM | POA: Diagnosis not present

## 2022-02-01 DIAGNOSIS — Z8249 Family history of ischemic heart disease and other diseases of the circulatory system: Secondary | ICD-10-CM | POA: Diagnosis not present

## 2022-02-01 DIAGNOSIS — M79602 Pain in left arm: Secondary | ICD-10-CM | POA: Diagnosis not present

## 2022-02-02 NOTE — Progress Notes (Signed)
?  Subjective:  ?Patient ID: Colleen Fuentes, female    DOB: 18-Jun-1966,  MRN: [id number] ? ?Chief Complaint  ?Patient presents with  ? Nail Problem  ?  Bilateral nail fungus Referring Provider: Marda Stalker   ? ? ?56 y.o. female presents with the above complaint. History confirmed with patient.  She had COVID last July and for started noticing her toenails lifting off and starting to thicken and change colors.  Now they do not seem to grow very much she takes OTC meds and they have not helped. ? ?Objective:  ?Physical Exam: ?warm, good capillary refill, no trophic changes or ulcerative lesions, normal DP and PT pulses, normal sensory exam, and bilateral hallux thickened discolored nail plate with onycholysis and dystrophy, less so on the second and third toenails as well ? ? ? ? ?Assessment:  ? ?1. Onychomycosis   ? ? ? ?Plan:  ?Patient was evaluated and treated and all questions answered. ? ?Discussed etiology and treatment options of onychomycosis and onycholysis in detail with the patient.  Discussed oral topical and laser treatment.  She has tried OTC topicals and these have not been helpful.  She would prefer to avoid any oral medications at this point due to the possibility of side effects.  We discussed laser treatment.  She will be scheduled for this and I will see her back in 6 months for follow-up photographs were taken today. ? ?Return in about 6 months (around 08/02/2022) for follow up after nail fungus treatment.  ? ?

## 2022-02-06 DIAGNOSIS — Z01419 Encounter for gynecological examination (general) (routine) without abnormal findings: Secondary | ICD-10-CM | POA: Diagnosis not present

## 2022-02-06 DIAGNOSIS — Z1231 Encounter for screening mammogram for malignant neoplasm of breast: Secondary | ICD-10-CM | POA: Diagnosis not present

## 2022-02-06 DIAGNOSIS — Z6824 Body mass index (BMI) 24.0-24.9, adult: Secondary | ICD-10-CM | POA: Diagnosis not present

## 2022-02-06 DIAGNOSIS — Z13820 Encounter for screening for osteoporosis: Secondary | ICD-10-CM | POA: Diagnosis not present

## 2022-02-16 ENCOUNTER — Other Ambulatory Visit: Payer: BLUE CROSS/BLUE SHIELD

## 2022-02-21 ENCOUNTER — Ambulatory Visit (INDEPENDENT_AMBULATORY_CARE_PROVIDER_SITE_OTHER): Payer: BLUE CROSS/BLUE SHIELD

## 2022-02-21 DIAGNOSIS — B351 Tinea unguium: Secondary | ICD-10-CM

## 2022-02-21 NOTE — Progress Notes (Signed)
Patient presents today for the 1st laser treatment. Diagnosed with mycotic nail infection by Dr. Sherryle Lis.  ? ?Toenail most affected bilateral hallux. ? ?All other systems are negative. ? ?Nails were filed thin. Laser therapy was administered to bilateral 1-5 toenails  and patient tolerated the treatment well. All safety precautions were in place.  ? ? ?Follow up in 4 weeks for laser # 2. ? ?

## 2022-03-30 ENCOUNTER — Other Ambulatory Visit: Payer: BLUE CROSS/BLUE SHIELD

## 2022-04-06 ENCOUNTER — Ambulatory Visit (INDEPENDENT_AMBULATORY_CARE_PROVIDER_SITE_OTHER): Payer: BLUE CROSS/BLUE SHIELD | Admitting: *Deleted

## 2022-04-06 DIAGNOSIS — B351 Tinea unguium: Secondary | ICD-10-CM

## 2022-04-06 NOTE — Progress Notes (Signed)
Patient presents today for the 2nd laser treatment. Diagnosed with mycotic nail infection by Dr. Sherryle Lis.   Toenail most affected bilateral hallux. 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 She is happy with the progress so far.  All other systems are negative.  Nails were filed thin. Laser therapy was administered to bilateral 1-5 toenails  and patient tolerated the treatment well. All safety precautions were in place.   ~One pass over the unaffected nails~  Follow up in 4 weeks for laser # 3.

## 2022-04-06 NOTE — Progress Notes (Signed)
Patient presents today for the 2nd  laser treatment. Diagnosed with mycotic nail infection by Dr. Sherryle Lis.   Toenail most affected bilateral hallux.  All other systems are negative.  Nails were filed thin. Laser therapy was administered to bilateral 1-5 toenails  and patient tolerated the treatment well. All safety precautions were in place.    Follow up in 4 weeks for laser # 3.

## 2022-05-18 ENCOUNTER — Other Ambulatory Visit: Payer: Self-pay

## 2022-05-22 DIAGNOSIS — Z8249 Family history of ischemic heart disease and other diseases of the circulatory system: Secondary | ICD-10-CM | POA: Diagnosis not present

## 2022-05-22 DIAGNOSIS — R9431 Abnormal electrocardiogram [ECG] [EKG]: Secondary | ICD-10-CM | POA: Diagnosis not present

## 2022-05-22 DIAGNOSIS — M79602 Pain in left arm: Secondary | ICD-10-CM | POA: Diagnosis not present

## 2022-05-31 ENCOUNTER — Ambulatory Visit (INDEPENDENT_AMBULATORY_CARE_PROVIDER_SITE_OTHER): Payer: Self-pay | Admitting: Podiatry

## 2022-05-31 DIAGNOSIS — B351 Tinea unguium: Secondary | ICD-10-CM

## 2022-05-31 NOTE — Progress Notes (Signed)
Patient presents today for the 3rd laser treatment. Diagnosed with mycotic nail infection by Dr. Sherryle Lis.    Toenail most affected bilateral hallux. 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 She is happy with the progress so far.   All other systems are negative.   Nails were filed thin. Laser therapy was administered to bilateral 1-5 toenails  and patient tolerated the treatment well. All safety precautions were in place.    ~One pass over the unaffected nails~   Follow up in 6 weeks for laser # 4.

## 2022-06-13 DIAGNOSIS — Z8249 Family history of ischemic heart disease and other diseases of the circulatory system: Secondary | ICD-10-CM | POA: Diagnosis not present

## 2022-06-13 DIAGNOSIS — R9431 Abnormal electrocardiogram [ECG] [EKG]: Secondary | ICD-10-CM | POA: Diagnosis not present

## 2022-06-14 DIAGNOSIS — Z8249 Family history of ischemic heart disease and other diseases of the circulatory system: Secondary | ICD-10-CM | POA: Diagnosis not present

## 2022-06-14 DIAGNOSIS — R9431 Abnormal electrocardiogram [ECG] [EKG]: Secondary | ICD-10-CM | POA: Diagnosis not present

## 2022-07-27 ENCOUNTER — Ambulatory Visit (INDEPENDENT_AMBULATORY_CARE_PROVIDER_SITE_OTHER): Payer: BLUE CROSS/BLUE SHIELD

## 2022-07-27 DIAGNOSIS — B351 Tinea unguium: Secondary | ICD-10-CM

## 2022-07-27 NOTE — Progress Notes (Signed)
Patient presents today for the 4th laser treatment. Diagnosed with mycotic nail infection by Dr. Sherryle Lis.    Toenail most affected bilateral hallux. 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 She is happy with the progress so far.   All other systems are negative.   Nails were filed thin. Laser therapy was administered to bilateral 1-5 toenails  and patient tolerated the treatment well. All safety precautions were in place.    ~One pass over the unaffected nails~   Follow up in 6 weeks for laser # 5.

## 2022-08-02 ENCOUNTER — Ambulatory Visit: Payer: BLUE CROSS/BLUE SHIELD | Admitting: Podiatry

## 2022-08-23 DIAGNOSIS — N3001 Acute cystitis with hematuria: Secondary | ICD-10-CM | POA: Diagnosis not present

## 2022-08-30 ENCOUNTER — Ambulatory Visit (INDEPENDENT_AMBULATORY_CARE_PROVIDER_SITE_OTHER): Payer: BLUE CROSS/BLUE SHIELD | Admitting: Podiatry

## 2022-08-30 DIAGNOSIS — B351 Tinea unguium: Secondary | ICD-10-CM

## 2022-09-03 NOTE — Progress Notes (Signed)
  Subjective:  Patient ID: Colleen Fuentes, female    DOB: 02/21/1966,  MRN: [id number]  Chief Complaint  Patient presents with   Nail Problem    6 month follow up nail fungus treatment    56 y.o. female presents with the above complaint. History confirmed with patient.  Over doing much better  Objective:  Physical Exam: warm, good capillary refill, no trophic changes or ulcerative lesions, normal DP and PT pulses, normal sensory exam, and bilateral hallux and second and third toenails have significant improvement probably 80 to 90%  Assessment:   1. Onychomycosis      Plan:  Patient was evaluated and treated and all questions answered.  Overall doing much better and is showing nearly 80 to 90% clearance.  I recommend she continue laser therapy if she seems to be responding well for this.  I will see her back in 6 months for final follow-up or as needed if it resolves completely.  Return in about 6 months (around 03/01/2023) for follow up after laser nail fungus treatment.

## 2022-09-14 ENCOUNTER — Other Ambulatory Visit: Payer: BLUE CROSS/BLUE SHIELD

## 2022-09-18 ENCOUNTER — Ambulatory Visit (INDEPENDENT_AMBULATORY_CARE_PROVIDER_SITE_OTHER): Payer: BLUE CROSS/BLUE SHIELD

## 2022-09-18 DIAGNOSIS — B351 Tinea unguium: Secondary | ICD-10-CM

## 2022-09-18 NOTE — Progress Notes (Signed)
Patient presents today for the 5th laser treatment. Diagnosed with mycotic nail infection by Dr. Sherryle Lis.    Toenail most affected bilateral hallux. 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 She is happy with the progress so far.   All other systems are negative.   Nails were filed thin. Laser therapy was administered to bilateral 1-5 toenails  and patient tolerated the treatment well. All safety precautions were in place.       Follow up in 4 weeks for laser # 6.

## 2022-10-22 ENCOUNTER — Other Ambulatory Visit: Payer: BLUE CROSS/BLUE SHIELD
